# Patient Record
Sex: Female | Born: 1970 | Race: Black or African American | Hispanic: No | Marital: Single | Smoking: Never smoker
Health system: Southern US, Community
[De-identification: ages and names within clinical notes are randomized; demographics above are authoritative.]

## PROBLEM LIST (undated history)

## (undated) DIAGNOSIS — K219 Gastro-esophageal reflux disease without esophagitis: Secondary | ICD-10-CM

## (undated) DIAGNOSIS — E785 Hyperlipidemia, unspecified: Secondary | ICD-10-CM

## (undated) DIAGNOSIS — I10 Essential (primary) hypertension: Secondary | ICD-10-CM

## (undated) HISTORY — DX: Hyperlipidemia, unspecified: E78.5

## (undated) HISTORY — PX: CHOLECYSTECTOMY: SHX55

## (undated) HISTORY — DX: Gastro-esophageal reflux disease without esophagitis: K21.9

## (undated) HISTORY — DX: Essential (primary) hypertension: I10

---

## 1999-07-15 ENCOUNTER — Encounter: Payer: Self-pay | Admitting: Emergency Medicine

## 1999-07-15 ENCOUNTER — Emergency Department (HOSPITAL_COMMUNITY): Admission: EM | Admit: 1999-07-15 | Discharge: 1999-07-15 | Payer: Self-pay | Admitting: Emergency Medicine

## 2002-11-23 ENCOUNTER — Encounter: Payer: Self-pay | Admitting: Emergency Medicine

## 2002-11-23 ENCOUNTER — Emergency Department (HOSPITAL_COMMUNITY): Admission: EM | Admit: 2002-11-23 | Discharge: 2002-11-23 | Payer: Self-pay | Admitting: Emergency Medicine

## 2008-12-01 ENCOUNTER — Emergency Department (HOSPITAL_BASED_OUTPATIENT_CLINIC_OR_DEPARTMENT_OTHER): Admission: EM | Admit: 2008-12-01 | Discharge: 2008-12-01 | Payer: Self-pay | Admitting: Emergency Medicine

## 2009-07-11 HISTORY — PX: BACK SURGERY: SHX140

## 2009-07-31 ENCOUNTER — Ambulatory Visit (HOSPITAL_COMMUNITY): Admission: RE | Admit: 2009-07-31 | Discharge: 2009-07-31 | Payer: Self-pay | Admitting: Neurosurgery

## 2010-11-02 LAB — CBC
HCT: 38.5 % (ref 36.0–46.0)
Hemoglobin: 13.1 g/dL (ref 12.0–15.0)
MCHC: 34.2 g/dL (ref 30.0–36.0)
MCV: 89.5 fL (ref 78.0–100.0)
Platelets: 466 10*3/uL — ABNORMAL HIGH (ref 150–400)
RBC: 4.3 MIL/uL (ref 3.87–5.11)
RDW: 13.2 % (ref 11.5–15.5)
WBC: 6.6 10*3/uL (ref 4.0–10.5)

## 2010-11-02 LAB — BASIC METABOLIC PANEL
BUN: 11 mg/dL (ref 6–23)
CO2: 28 mEq/L (ref 19–32)
Calcium: 9.4 mg/dL (ref 8.4–10.5)
Chloride: 102 mEq/L (ref 96–112)
Creatinine, Ser: 0.92 mg/dL (ref 0.4–1.2)
GFR calc Af Amer: >60 mL/min (ref >60)
GFR calc non Af Amer: >60 mL/min (ref >60)
Glucose, Bld: 116 mg/dL — ABNORMAL HIGH (ref 70–99)
Potassium: 4.8 mEq/L (ref 3.5–5.1)
Sodium: 137 mEq/L (ref 135–145)

## 2011-07-19 IMAGING — CR DG CHEST 2V
2 series · 2 of 2 positions shown · non-contrast
Comparison: None

CLINICAL DATA: HNP.  Preadmission for OR.  Hypertension.

CHEST - 2 VIEW

[view not recorded (1 of 2)]
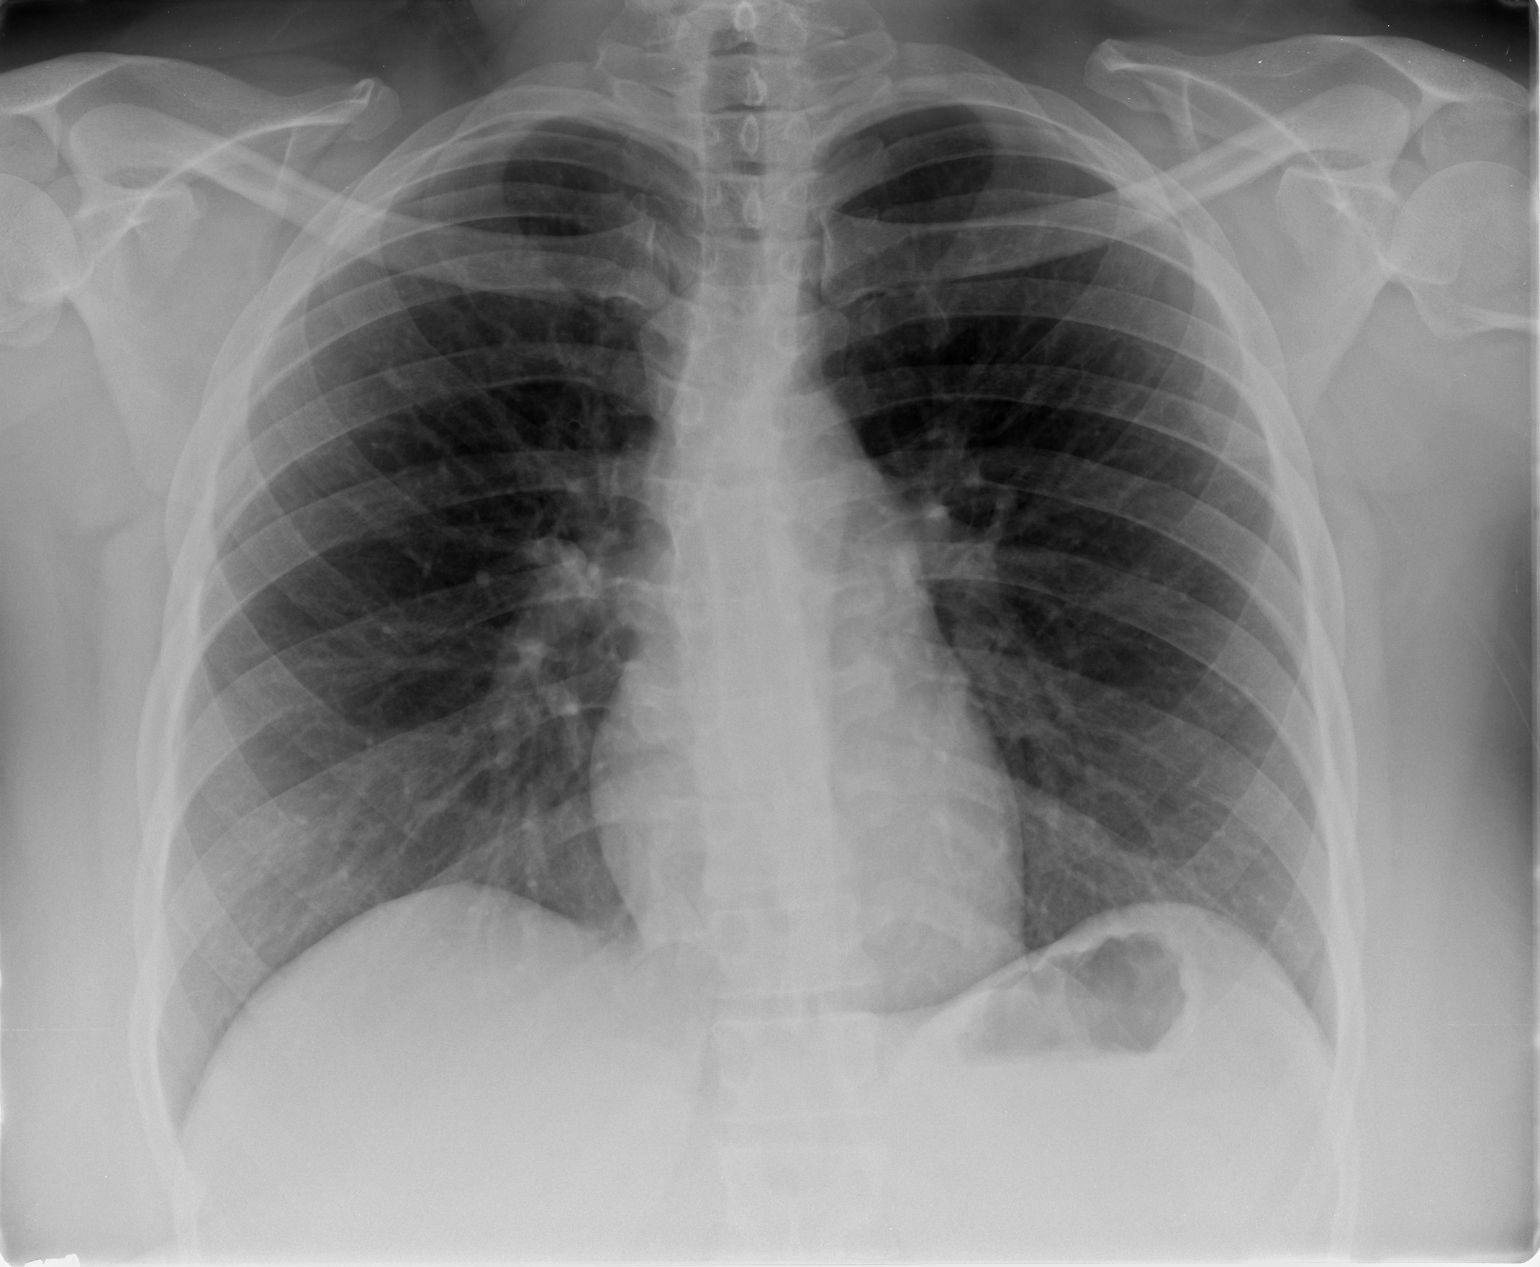

[view not recorded (2 of 2)]
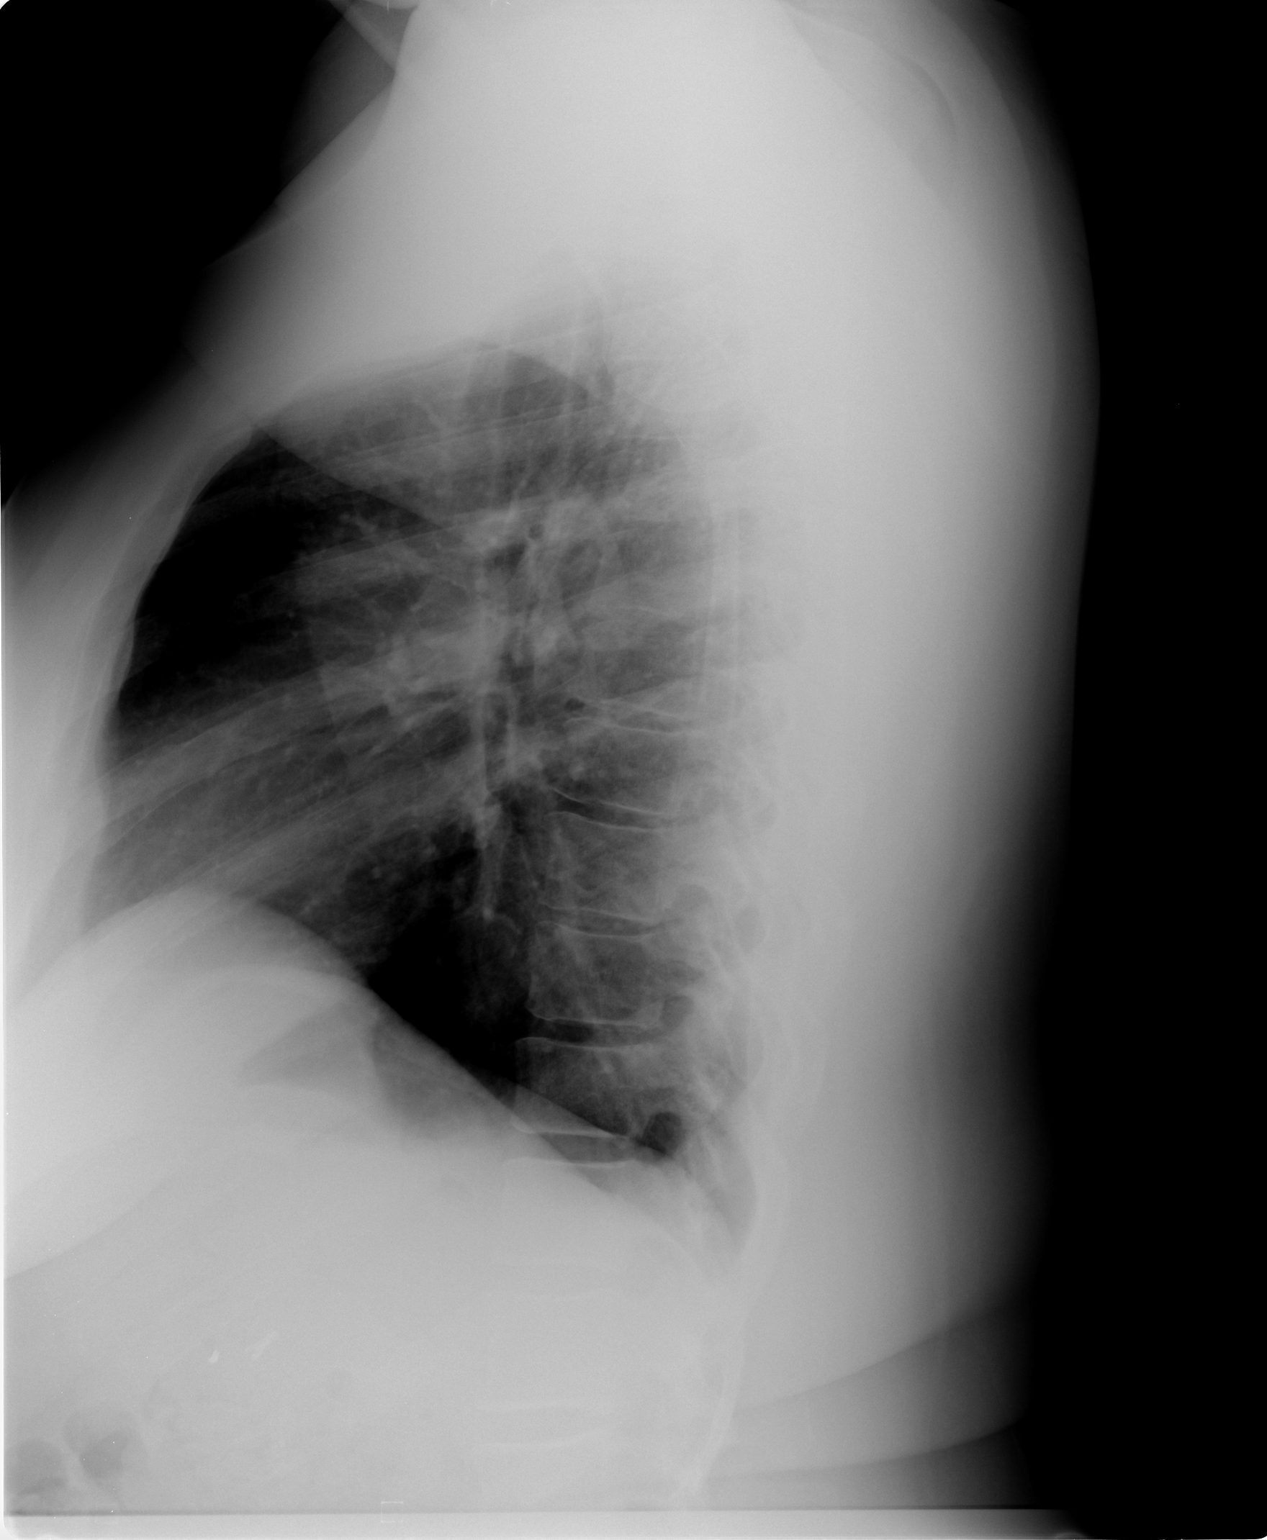

[2 of 2 positions shown; findings below may reference images not displayed]

FINDINGS: Normal cardiomediastinal silhouette.  Lungs are clear of
an active process.  Minimal dextroscoliosis.
IMPRESSION: No acute chest findings.

## 2012-08-08 LAB — HM PAP SMEAR: HM Pap smear: NORMAL

## 2013-04-10 ENCOUNTER — Other Ambulatory Visit: Payer: Self-pay | Admitting: Family Medicine

## 2013-04-16 ENCOUNTER — Other Ambulatory Visit: Payer: Self-pay | Admitting: Family Medicine

## 2013-08-06 ENCOUNTER — Encounter: Payer: Self-pay | Admitting: *Deleted

## 2013-08-06 DIAGNOSIS — E785 Hyperlipidemia, unspecified: Secondary | ICD-10-CM | POA: Insufficient documentation

## 2013-08-06 DIAGNOSIS — I10 Essential (primary) hypertension: Secondary | ICD-10-CM | POA: Insufficient documentation

## 2013-08-06 DIAGNOSIS — K219 Gastro-esophageal reflux disease without esophagitis: Secondary | ICD-10-CM | POA: Insufficient documentation

## 2013-08-06 DIAGNOSIS — E559 Vitamin D deficiency, unspecified: Secondary | ICD-10-CM | POA: Insufficient documentation

## 2013-08-24 ENCOUNTER — Other Ambulatory Visit: Payer: Self-pay | Admitting: Family Medicine

## 2013-09-01 ENCOUNTER — Other Ambulatory Visit: Payer: Self-pay | Admitting: Family Medicine

## 2013-09-17 ENCOUNTER — Other Ambulatory Visit: Payer: Self-pay | Admitting: *Deleted

## 2013-09-17 DIAGNOSIS — R5381 Other malaise: Secondary | ICD-10-CM

## 2013-09-17 DIAGNOSIS — E119 Type 2 diabetes mellitus without complications: Secondary | ICD-10-CM

## 2013-09-17 DIAGNOSIS — E559 Vitamin D deficiency, unspecified: Secondary | ICD-10-CM

## 2013-09-17 DIAGNOSIS — R5383 Other fatigue: Secondary | ICD-10-CM

## 2013-09-17 DIAGNOSIS — E785 Hyperlipidemia, unspecified: Secondary | ICD-10-CM

## 2013-09-20 ENCOUNTER — Other Ambulatory Visit (INDEPENDENT_AMBULATORY_CARE_PROVIDER_SITE_OTHER): Payer: 59

## 2013-09-20 DIAGNOSIS — E785 Hyperlipidemia, unspecified: Secondary | ICD-10-CM

## 2013-09-20 DIAGNOSIS — I10 Essential (primary) hypertension: Secondary | ICD-10-CM

## 2013-09-20 DIAGNOSIS — E559 Vitamin D deficiency, unspecified: Secondary | ICD-10-CM

## 2013-09-20 LAB — CBC WITH DIFFERENTIAL/PLATELET
Basophils Absolute: 0 10*3/uL (ref 0.0–0.1)
Basophils Relative: 0 % (ref 0–1)
Eosinophils Absolute: 0.1 10*3/uL (ref 0.0–0.7)
Eosinophils Relative: 1 % (ref 0–5)
HCT: 38.6 % (ref 36.0–46.0)
Hemoglobin: 13 g/dL (ref 12.0–15.0)
Lymphocytes Relative: 27 % (ref 12–46)
Lymphs Abs: 1.8 10*3/uL (ref 0.7–4.0)
MCH: 28.7 pg (ref 26.0–34.0)
MCHC: 33.7 g/dL (ref 30.0–36.0)
MCV: 85.2 fL (ref 78.0–100.0)
Monocytes Absolute: 0.5 10*3/uL (ref 0.1–1.0)
Monocytes Relative: 8 % (ref 3–12)
Neutro Abs: 4.3 10*3/uL (ref 1.7–7.7)
Neutrophils Relative %: 64 % (ref 43–77)
Platelets: 441 10*3/uL — ABNORMAL HIGH (ref 150–400)
RBC: 4.53 MIL/uL (ref 3.87–5.11)
RDW: 14.4 % (ref 11.5–15.5)
WBC: 6.7 10*3/uL (ref 4.0–10.5)

## 2013-09-20 LAB — TSH: TSH: 2.095 u[IU]/mL (ref 0.350–4.500)

## 2013-09-20 LAB — LIPID PANEL
Cholesterol: 156 mg/dL (ref 0–200)
HDL: 40 mg/dL (ref >39)
LDL Cholesterol: 101 mg/dL — ABNORMAL HIGH (ref 0–99)
Total CHOL/HDL Ratio: 3.9 Ratio
Triglycerides: 73 mg/dL (ref <150)
VLDL: 15 mg/dL (ref 0–40)

## 2013-09-20 LAB — COMPLETE METABOLIC PANEL WITH GFR
ALT: 11 U/L (ref 0–35)
AST: 11 U/L (ref 0–37)
Albumin: 3.9 g/dL (ref 3.5–5.2)
Alkaline Phosphatase: 63 U/L (ref 39–117)
BUN: 10 mg/dL (ref 6–23)
CO2: 25 mEq/L (ref 19–32)
Calcium: 8.9 mg/dL (ref 8.4–10.5)
Chloride: 104 mEq/L (ref 96–112)
Creat: 0.96 mg/dL (ref 0.50–1.10)
GFR, Est African American: 85 mL/min
GFR, Est Non African American: 74 mL/min
Glucose, Bld: 102 mg/dL — ABNORMAL HIGH (ref 70–99)
Potassium: 4.2 mEq/L (ref 3.5–5.3)
Sodium: 137 mEq/L (ref 135–145)
Total Bilirubin: 0.4 mg/dL (ref 0.2–1.2)
Total Protein: 7 g/dL (ref 6.0–8.3)

## 2013-09-20 LAB — HEMOGLOBIN A1C
Hgb A1c MFr Bld: 5.7 % — ABNORMAL HIGH (ref <5.7)
Mean Plasma Glucose: 117 mg/dL — ABNORMAL HIGH (ref <117)

## 2013-09-21 ENCOUNTER — Encounter: Payer: Self-pay | Admitting: Family Medicine

## 2013-09-21 ENCOUNTER — Ambulatory Visit (INDEPENDENT_AMBULATORY_CARE_PROVIDER_SITE_OTHER): Payer: 59 | Admitting: Family Medicine

## 2013-09-21 ENCOUNTER — Encounter (INDEPENDENT_AMBULATORY_CARE_PROVIDER_SITE_OTHER): Payer: Self-pay

## 2013-09-21 VITALS — BP 126/91 | HR 89 | Resp 16 | Ht 68.0 in | Wt 245.0 lb

## 2013-09-21 DIAGNOSIS — E559 Vitamin D deficiency, unspecified: Secondary | ICD-10-CM

## 2013-09-21 DIAGNOSIS — Z3041 Encounter for surveillance of contraceptive pills: Secondary | ICD-10-CM

## 2013-09-21 DIAGNOSIS — R7301 Impaired fasting glucose: Secondary | ICD-10-CM

## 2013-09-21 DIAGNOSIS — I10 Essential (primary) hypertension: Secondary | ICD-10-CM

## 2013-09-21 LAB — VITAMIN D 25 HYDROXY (VIT D DEFICIENCY, FRACTURES): Vit D, 25-Hydroxy: 34 ng/mL (ref 30–89)

## 2013-09-21 MED ORDER — TRIAMTERENE-HCTZ 75-50 MG PO TABS: ORAL_TABLET | ORAL | Status: AC

## 2013-09-21 MED ORDER — NORGESTIMATE-ETH ESTRADIOL 0.25-35 MG-MCG PO TABS: 1.0000 | ORAL_TABLET | Freq: Every day | ORAL | Status: AC

## 2013-09-21 MED ORDER — ERGOCALCIFEROL 1.25 MG (50000 UT) PO CAPS: 50000.0000 [IU] | ORAL_CAPSULE | ORAL | Status: AC

## 2013-09-21 NOTE — Progress Notes (Signed)
Subjective:    Patient ID: Theresa Mcguire, female    DOB: May 29, 1971, 43 y.o.   MRN: [id number]  HPI  Theresa Mcguire is here today to go over her most recent lab result.  She has done well since her last office visit.  She needs a refill on all of her medications.   1)  Hypertension - She continues to do well with her Triam/HCTZ (75-50 mg, 1/2 pill daily).  2)  IFG - She has not been taking Janumet in the past two months. She has been working harder on diet and exercise.   3)  Vitamin D Deficiency - She needs a refill on her Vitamin D.    Review of Systems  Constitutional: Negative for activity change, fatigue and unexpected weight change.  HENT: Negative.   Eyes: Negative.   Respiratory: Negative for shortness of breath.   Cardiovascular: Negative for chest pain, palpitations and leg swelling.  Gastrointestinal: Negative for diarrhea and constipation.  Endocrine: Negative.   Genitourinary: Negative for difficulty urinating.  Musculoskeletal: Negative.   Skin: Negative.   Neurological: Negative.   Hematological: Negative for adenopathy. Does not bruise/bleed easily.  Psychiatric/Behavioral: Negative for sleep disturbance and dysphoric mood. The patient is not nervous/anxious.     Past Medical History  Diagnosis Date  . Hyperlipidemia   . Hypertension   . GERD (gastroesophageal reflux disease)      Past Surgical History  Procedure Laterality Date  . Cholecystectomy    . Back surgery  07/11/2009     History   Social History Narrative   Marital Status: Single   Children:  Daughter [name]([name])     Pets: None   Living Situation: Lives alone with her daughter.   Occupation: Arts development officerafety Specialist  - [employer]   Education: She completed 3 years in business at [employer] and is currently finishing her degree at [employer] in [employer].     Tobacco Use/Exposure:  None    Alcohol Use: None   Drug Use:  None   Diet:  Regular   Exercise:  Top Performance (Zumba)    Hobbies: Traveling              Family History  Problem Relation Age of Onset  . Hyperlipidemia Mother   . Cancer Father   . Throat cancer Father   . Brain cancer Father   . Congestive Heart Failure Sister   . Diabetes Maternal Aunt   . Hypertension Maternal Aunt   . Diabetes Paternal Aunt      Current Outpatient Prescriptions on File Prior to Visit  Medication Sig Dispense Refill  . sitaGLIPtin-metformin (JANUMET) 50-500 MG per tablet Take 1 tablet by mouth 2 (two) times daily with a meal.       No current facility-administered medications on file prior to visit.     No Known Allergies   Immunization History  Administered Date(s) Administered  . Tdap 12/08/2010      Objective:   Physical Exam  Vitals reviewed. Constitutional: She is oriented to person, place, and time.  Eyes: Conjunctivae are normal. No scleral icterus.  Neck: Neck supple. No thyromegaly present.  Cardiovascular: Normal rate, regular rhythm and normal heart sounds.   Pulmonary/Chest: Effort normal and breath sounds normal.  Musculoskeletal: She exhibits no edema and no tenderness.  Lymphadenopathy:    She has no cervical adenopathy.  Neurological: She is alert and oriented to person, place, and time.  Skin: Skin is warm and dry.  Psychiatric:  She has a normal mood and affect. Her behavior is normal. Judgment and thought content normal.      Assessment & Plan:   Avangeline was seen today for medication management.  Diagnoses and associated orders for this visit:  Essential hypertension, benign - triamterene-hydrochlorothiazide (MAXZIDE) 75-50 MG per tablet; Take 1/2 - 1 tab daily for BP  Unspecified vitamin D deficiency - ergocalciferol (VITAMIN D2) 50000 UNITS capsule; Take 1 capsule (50,000 Units total) by mouth 2 (two) times a week.  Oral contraceptive pill surveillance Comments: [name] is interested in other means of contraception.  We discussed her various options.  She thinks that the  Mirena would be a good option for her.  She was given Dr. Rudie Meyer name and number.   - norgestimate-ethinyl estradiol (ORTHO-CYCLEN,SPRINTEC,PREVIFEM) 0.25-35 MG-MCG tablet; Take 1 tablet by mouth daily.  Impaired fasting glucose Comments: Her A1c is almost perfect off of medication (5.7%).  She will continue to work on diet and exercise.    TIME SPENT "FACE TO FACE" WITH PATIENT -  30 MINS

## 2013-09-21 NOTE — Patient Instructions (Addendum)
Levonorgestrel intrauterine device (IUD) What is this medicine? LEVONORGESTREL IUD (LEE voe nor jes trel) is a contraceptive (birth control) device. The device is placed inside the uterus by a healthcare professional. It is used to prevent pregnancy and can also be used to treat heavy bleeding that occurs during your period. Depending on the device, it can be used for 3 to 5 years. This medicine may be used for other purposes; ask your health care provider or pharmacist if you have questions. COMMON BRAND NAME(S): Mirena, Skyla What should I tell my health care provider before I take this medicine? They need to know if you have any of these conditions: -abnormal Pap smear -cancer of the breast, uterus, or cervix -diabetes -endometritis -genital or pelvic infection now or in the past -have more than one sexual partner or your partner has more than one partner -heart disease -history of an ectopic or tubal pregnancy -immune system problems -IUD in place -liver disease or tumor -problems with blood clots or take blood-thinners -use intravenous drugs -uterus of unusual shape -vaginal bleeding that has not been explained -an unusual or allergic reaction to levonorgestrel, other hormones, silicone, or polyethylene, medicines, foods, dyes, or preservatives -pregnant or trying to get pregnant -breast-feeding How should I use this medicine? This device is placed inside the uterus by a health care professional. Talk to your pediatrician regarding the use of this medicine in children. Special care may be needed. Overdosage: If you think you have taken too much of this medicine contact a poison control center or emergency room at once. NOTE: This medicine is only for you. Do not share this medicine with others. What if I miss a dose? This does not apply. What may interact with this medicine? Do not take this medicine with any of the following  medications: -amprenavir -bosentan -fosamprenavir This medicine may also interact with the following medications: -aprepitant -barbiturate medicines for inducing sleep or treating seizures -bexarotene -griseofulvin -medicines to treat seizures like carbamazepine, ethotoin, felbamate, oxcarbazepine, phenytoin, topiramate -modafinil -pioglitazone -rifabutin -rifampin -rifapentine -some medicines to treat HIV infection like atazanavir, indinavir, lopinavir, nelfinavir, tipranavir, ritonavir -St. John's wort -warfarin This list may not describe all possible interactions. Give your health care provider a list of all the medicines, herbs, non-prescription drugs, or dietary supplements you use. Also tell them if you smoke, drink alcohol, or use illegal drugs. Some items may interact with your medicine. What should I watch for while using this medicine? Visit your doctor or health care professional for regular check ups. See your doctor if you or your partner has sexual contact with others, becomes HIV positive, or gets a sexual transmitted disease. This product does not protect you against HIV infection (AIDS) or other sexually transmitted diseases. You can check the placement of the IUD yourself by reaching up to the top of your vagina with clean fingers to feel the threads. Do not pull on the threads. It is a good habit to check placement after each menstrual period. Call your doctor right away if you feel more of the IUD than just the threads or if you cannot feel the threads at all. The IUD may come out by itself. You may become pregnant if the device comes out. If you notice that the IUD has come out use a backup birth control method like condoms and call your health care provider. Using tampons will not change the position of the IUD and are okay to use during your period. What side effects may I   notice from receiving this medicine? Side effects that you should report to your doctor or  health care professional as soon as possible: -allergic reactions like skin rash, itching or hives, swelling of the face, lips, or tongue -fever, flu-like symptoms -genital sores -high blood pressure -no menstrual period for 6 weeks during use -pain, swelling, warmth in the leg -pelvic pain or tenderness -severe or sudden headache -signs of pregnancy -stomach cramping -sudden shortness of breath -trouble with balance, talking, or walking -unusual vaginal bleeding, discharge -yellowing of the eyes or skin Side effects that usually do not require medical attention (report to your doctor or health care professional if they continue or are bothersome): -acne -breast pain -change in sex drive or performance -changes in weight -cramping, dizziness, or faintness while the device is being inserted -headache -irregular menstrual bleeding within first 3 to 6 months of use -nausea This list may not describe all possible side effects. Call your doctor for medical advice about side effects. You may report side effects to FDA at 1-800-FDA-1088. Where should I keep my medicine? This does not apply. NOTE: This sheet is a summary. It may not cover all possible information. If you have questions about this medicine, talk to your doctor, pharmacist, or health care provider.  2014, Elsevier/Gold Standard. (2011-08-19 13:54:04)  

## 2013-09-23 ENCOUNTER — Encounter: Payer: Self-pay | Admitting: Family Medicine

## 2013-09-23 DIAGNOSIS — R7301 Impaired fasting glucose: Secondary | ICD-10-CM | POA: Insufficient documentation

## 2013-09-23 DIAGNOSIS — Z3041 Encounter for surveillance of contraceptive pills: Secondary | ICD-10-CM | POA: Insufficient documentation

## 2013-10-18 ENCOUNTER — Ambulatory Visit (INDEPENDENT_AMBULATORY_CARE_PROVIDER_SITE_OTHER): Payer: 59 | Admitting: Family Medicine

## 2013-10-18 ENCOUNTER — Other Ambulatory Visit (HOSPITAL_COMMUNITY)
Admission: RE | Admit: 2013-10-18 | Discharge: 2013-10-18 | Disposition: A | Discharge status: Home / Self Care | Payer: 59 | Source: Ambulatory Visit | Attending: Family Medicine | Admitting: Family Medicine

## 2013-10-18 ENCOUNTER — Encounter: Payer: Self-pay | Admitting: Family Medicine

## 2013-10-18 VITALS — BP 123/85 | HR 83 | Resp 16 | Ht 68.0 in | Wt 243.0 lb

## 2013-10-18 DIAGNOSIS — B372 Candidiasis of skin and nail: Secondary | ICD-10-CM

## 2013-10-18 DIAGNOSIS — L0291 Cutaneous abscess, unspecified: Secondary | ICD-10-CM

## 2013-10-18 DIAGNOSIS — Z1151 Encounter for screening for human papillomavirus (HPV): Secondary | ICD-10-CM | POA: Insufficient documentation

## 2013-10-18 DIAGNOSIS — Z124 Encounter for screening for malignant neoplasm of cervix: Secondary | ICD-10-CM | POA: Insufficient documentation

## 2013-10-18 DIAGNOSIS — Z01419 Encounter for gynecological examination (general) (routine) without abnormal findings: Secondary | ICD-10-CM

## 2013-10-18 DIAGNOSIS — L0390 Cellulitis, unspecified: Secondary | ICD-10-CM

## 2013-10-18 DIAGNOSIS — R300 Dysuria: Secondary | ICD-10-CM

## 2013-10-18 LAB — POCT URINALYSIS DIPSTICK
Bilirubin, UA: NEGATIVE
Blood, UA: NEGATIVE
Glucose, UA: NEGATIVE
Ketones, UA: NEGATIVE
Leukocytes, UA: NEGATIVE
Nitrite, UA: NEGATIVE
Protein, UA: NEGATIVE
Spec Grav, UA: 1.015
Urobilinogen, UA: NEGATIVE
pH, UA: 7

## 2013-10-18 MED ORDER — MUPIROCIN 2 % EX OINT: 1.0000 "application " | TOPICAL_OINTMENT | Freq: Two times a day (BID) | CUTANEOUS | Status: AC

## 2013-10-18 MED ORDER — FLUCONAZOLE 200 MG PO TABS: ORAL_TABLET | ORAL | Status: AC

## 2013-10-18 MED ORDER — NYSTATIN 100000 UNIT/GM EX CREA: 1.0000 "application " | TOPICAL_CREAM | Freq: Two times a day (BID) | CUTANEOUS | Status: AC

## 2013-10-18 NOTE — Patient Instructions (Signed)

## 2013-10-18 NOTE — Progress Notes (Signed)
Subjective:    Patient ID: Theresa Mcguire, female    DOB: 02-16-1971, 43 y.o.   MRN: [id number]  HPI  Theresa Mcguire is here today for her annual CPE with a pap smear.  She has done well since her last visit.  Her LMP was on 09/29/13.  Overall she feels that she is in good health.  Her only concern is some burning with urination.  She has had this problem for about two weeks.    Review of Systems  Constitutional: Negative for activity change, appetite change, fatigue and unexpected weight change.  HENT: Negative for congestion, dental problem, ear pain, hearing loss, trouble swallowing and voice change.   Eyes: Negative for pain, redness and visual disturbance.  Respiratory: Negative for cough and shortness of breath.   Cardiovascular: Negative for chest pain, palpitations and leg swelling.  Gastrointestinal: Negative for nausea, vomiting, abdominal pain, diarrhea, constipation and blood in stool.  Endocrine: Negative for cold intolerance, heat intolerance, polydipsia, polyphagia and polyuria.  Genitourinary: Negative for dysuria, urgency, frequency, hematuria, vaginal discharge and pelvic pain.       Burning with urination.   Musculoskeletal: Negative for arthralgias, back pain, joint swelling, myalgias and neck pain.  Skin: Negative for rash.  Neurological: Negative for dizziness, weakness and headaches.  Hematological: Negative for adenopathy. Does not bruise/bleed easily.  Psychiatric/Behavioral: Negative for sleep disturbance, dysphoric mood and decreased concentration. The patient is not nervous/anxious.      Past Medical History  Diagnosis Date  . Hyperlipidemia   . Hypertension   . GERD (gastroesophageal reflux disease)      Past Surgical History  Procedure Laterality Date  . Cholecystectomy    . Back surgery  07/11/2009     History   Social History Narrative   Marital Status: Single   Children:  Daughter Occupational psychologist([name])     Pets: None   Living Situation: Lives with  daughter.     Occupation: Arts development officerafety Specialist  - [employer]   Education: [employer] ([employer] in [employer]); She is currently enrolled in the [employer]/HR at [employer].     Tobacco Use/Exposure:  None    Alcohol Use: None   Drug Use:  None   Diet:  Regular   Exercise:  Top Performance (Zumba)    Hobbies: Traveling                 Family History  Problem Relation Age of Onset  . Hyperlipidemia Mother   . Cancer Father   . Throat cancer Father   . Brain cancer Father   . Congestive Heart Failure Sister   . Diabetes Maternal Aunt   . Hypertension Maternal Aunt   . Diabetes Paternal Aunt      Current Outpatient Prescriptions on File Prior to Visit  Medication Sig Dispense Refill  . ergocalciferol (VITAMIN D2) 50000 UNITS capsule Take 1 capsule (50,000 Units total) by mouth 2 (two) times a week.  24 capsule  3  . norgestimate-ethinyl estradiol (ORTHO-CYCLEN,SPRINTEC,PREVIFEM) 0.25-35 MG-MCG tablet Take 1 tablet by mouth daily.  1 Package  11  . triamterene-hydrochlorothiazide (MAXZIDE) 75-50 MG per tablet Take 1/2 - 1 tab daily for BP  90 tablet  1   No current facility-administered medications on file prior to visit.     No Known Allergies   Immunization History  Administered Date(s) Administered  . Tdap 12/08/2010       Objective:   Physical Exam  Constitutional: She is oriented to person,  place, and time. She appears well-developed and well-nourished.  HENT:  Head: Normocephalic and atraumatic.  Right Ear: External ear normal.  Left Ear: External ear normal.  Nose: Nose normal.  Mouth/Throat: Oropharynx is clear and moist.  Eyes: Conjunctivae are normal. Pupils are equal, round, and reactive to light. No scleral icterus.  Neck: Normal range of motion. Neck supple. No thyromegaly present.  Cardiovascular: Normal rate, regular rhythm, normal heart sounds and intact distal pulses.  Exam reveals no gallop and no friction rub.   No murmur  heard. Pulmonary/Chest: Effort normal and breath sounds normal.  Abdominal: Soft. Bowel sounds are normal.  Genitourinary: Vagina normal and uterus normal.  Musculoskeletal: Normal range of motion. She exhibits no edema and no tenderness.  Lymphadenopathy:    She has no cervical adenopathy.  Neurological: She is alert and oriented to person, place, and time. She has normal reflexes.  Skin: Skin is warm and dry.  Psychiatric: She has a normal mood and affect. Her behavior is normal. Judgment and thought content normal.       Assessment & Plan:  Melika was seen today for annual exam.  Diagnoses and associated orders for this visit:  Routine gynecological examination The patient had a normal CPE.  We addressed preventative issues appropriate for her age.    Screening for malignant neoplasm of the cervix Pap smear was done without any difficulty and was sent to [facility]. She will receive her results in the mail. She was told that if her result is abnormal, we will bring her in to discuss the abnormality in great detail.   - Cytology - PAP  Candidiasis of skin        Comments:  A U/A was done which was WNL.  She has a yeast infection which would explain her pain with urination.  We'll treat this which should improve her symptoms.   - fluconazole (DIFLUCAN) 200 MG tablet; Take 1 tab po QOD x 7 days - nystatin cream (MYCOSTATIN); Apply 1 application topically 2 (two) times daily.  Cellulitis - mupirocin ointment (BACTROBAN) 2 %; Apply 1 application topically 2 (two) times daily
